# Patient Record
Sex: Female | Born: 1995 | Race: Black or African American | Hispanic: No | Marital: Single | Smoking: Never smoker
Health system: Southern US, Community
[De-identification: ages and names within clinical notes are randomized; demographics above are authoritative.]

## PROBLEM LIST (undated history)

## (undated) DIAGNOSIS — G43909 Migraine, unspecified, not intractable, without status migrainosus: Secondary | ICD-10-CM

## (undated) DIAGNOSIS — N6310 Unspecified lump in the right breast, unspecified quadrant: Secondary | ICD-10-CM

## (undated) DIAGNOSIS — M5430 Sciatica, unspecified side: Secondary | ICD-10-CM

## (undated) DIAGNOSIS — D573 Sickle-cell trait: Secondary | ICD-10-CM

## (undated) HISTORY — PX: NO PAST SURGERIES: SHX2092

---
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
## 2016-10-17 IMAGING — CR DG LUMBAR SPINE COMPLETE 4+V
5 series · 5 of 5 positions shown · non-contrast
Comparison: None.

CLINICAL DATA: Chronic right-sided back pain onset 2 months ago.
Pain radiates to the right leg.

EXAM:
LUMBAR SPINE - COMPLETE 4+ VIEW

[t lumbar spine ap]
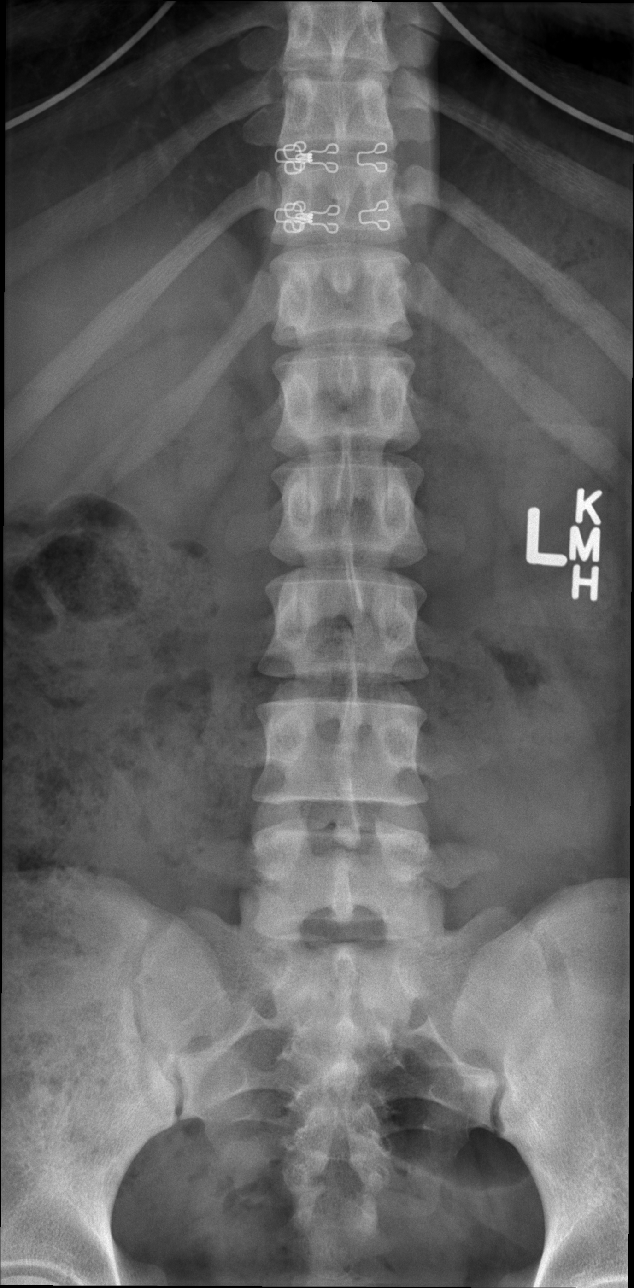

[t lumbar spine obl (1 of 2)]
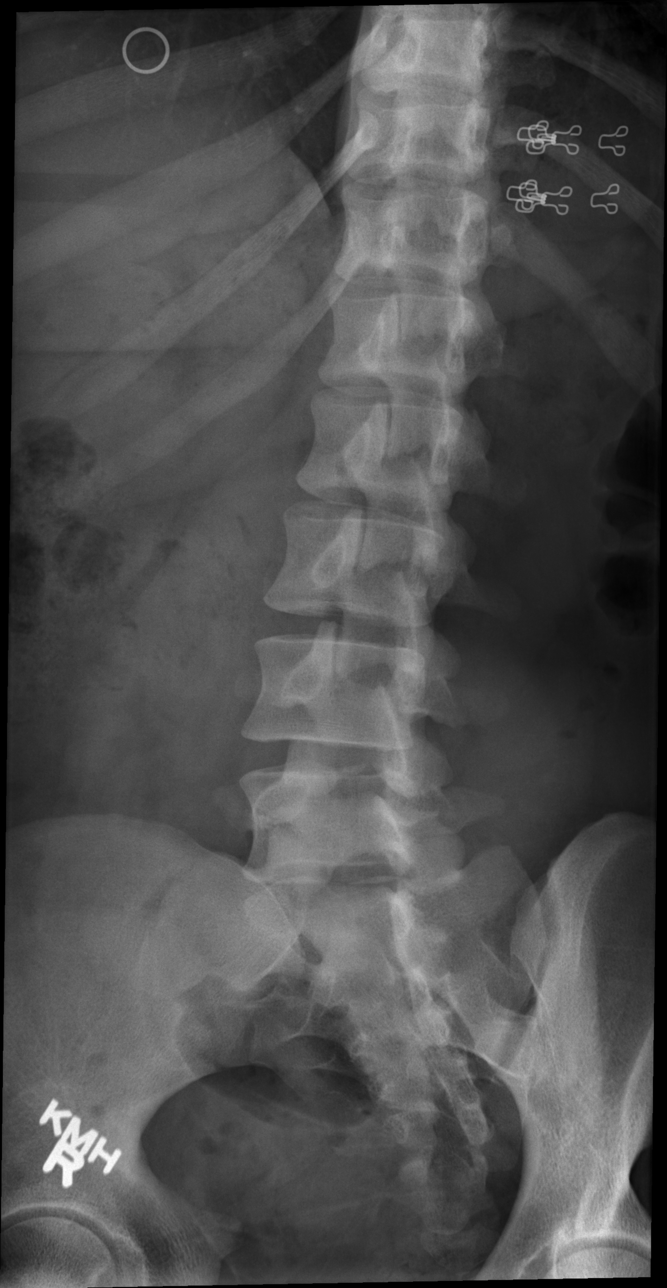

[t lumbar spine obl (2 of 2)]
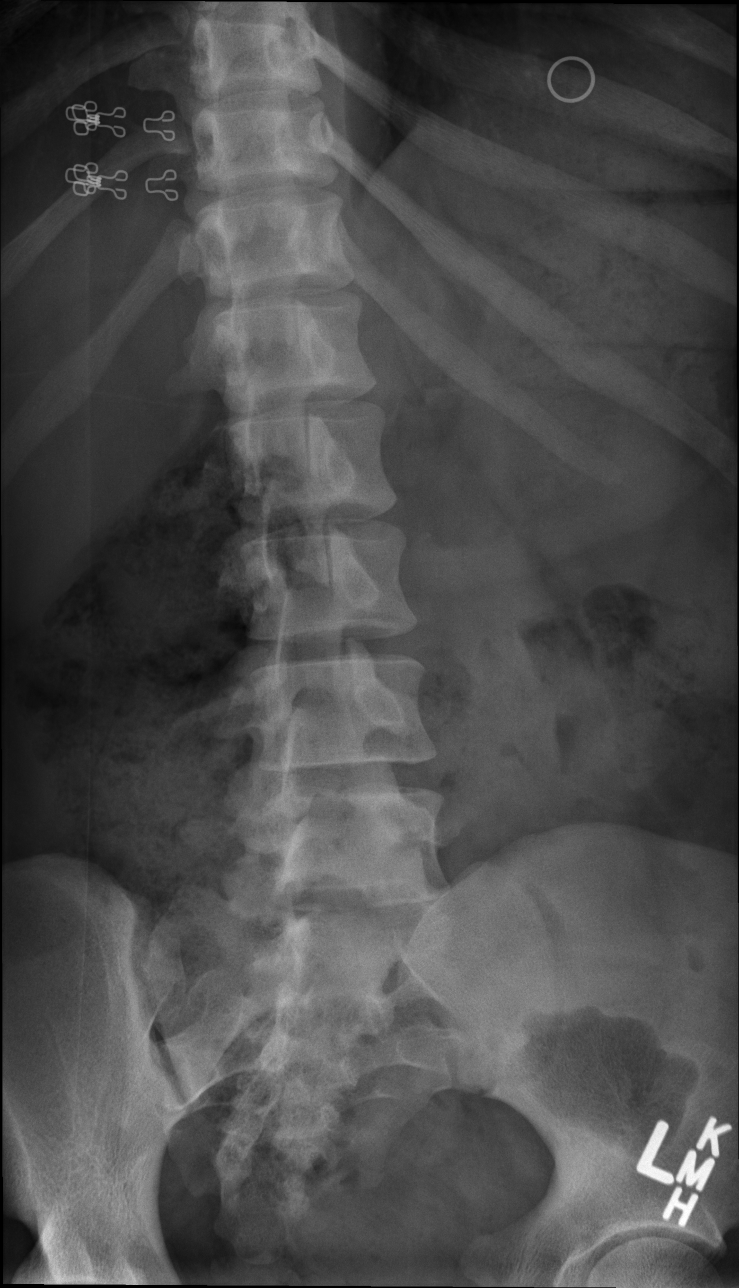

[t lumbar spine lat]
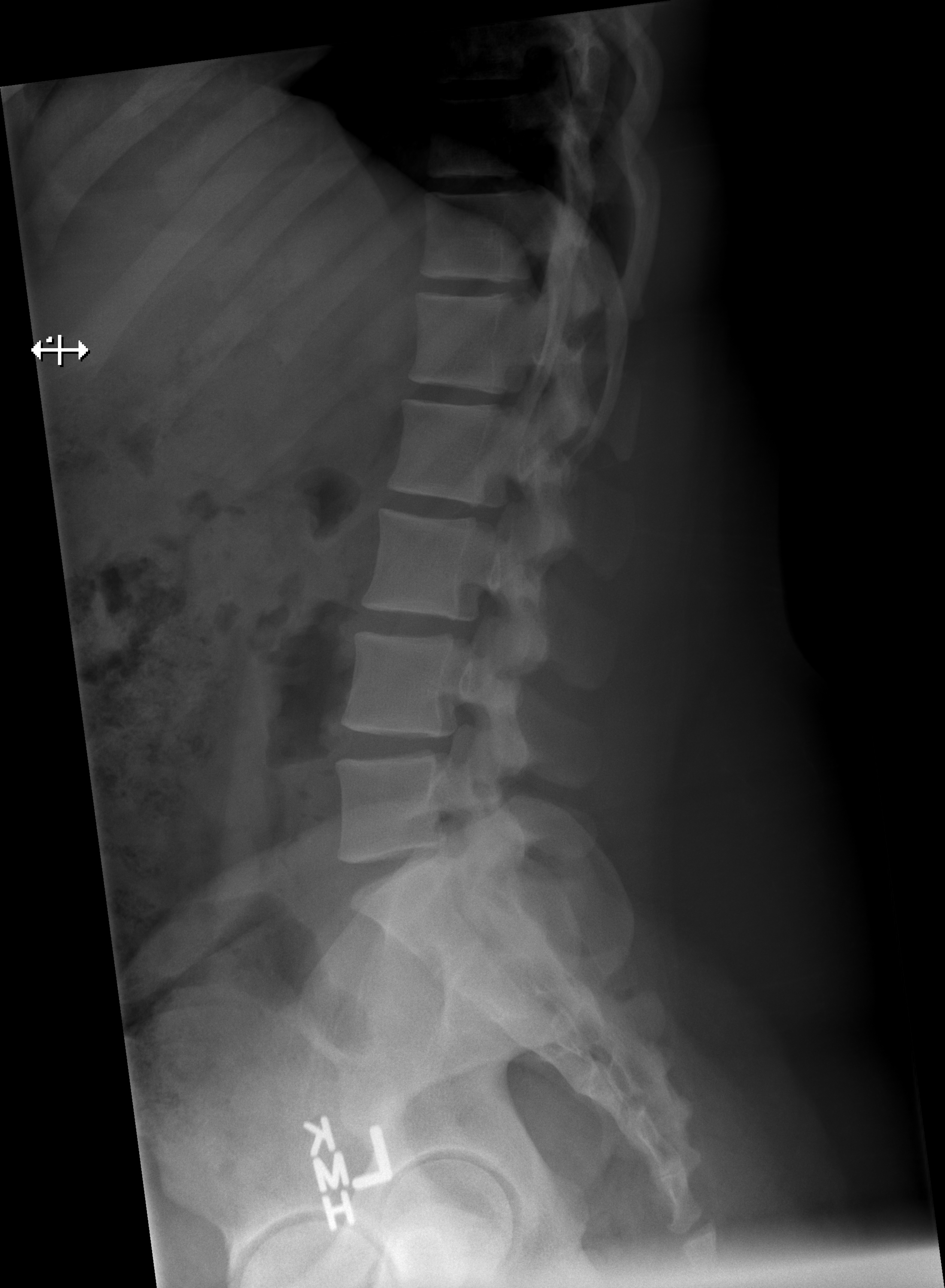

[t lumbar l-5 s-1 spot]
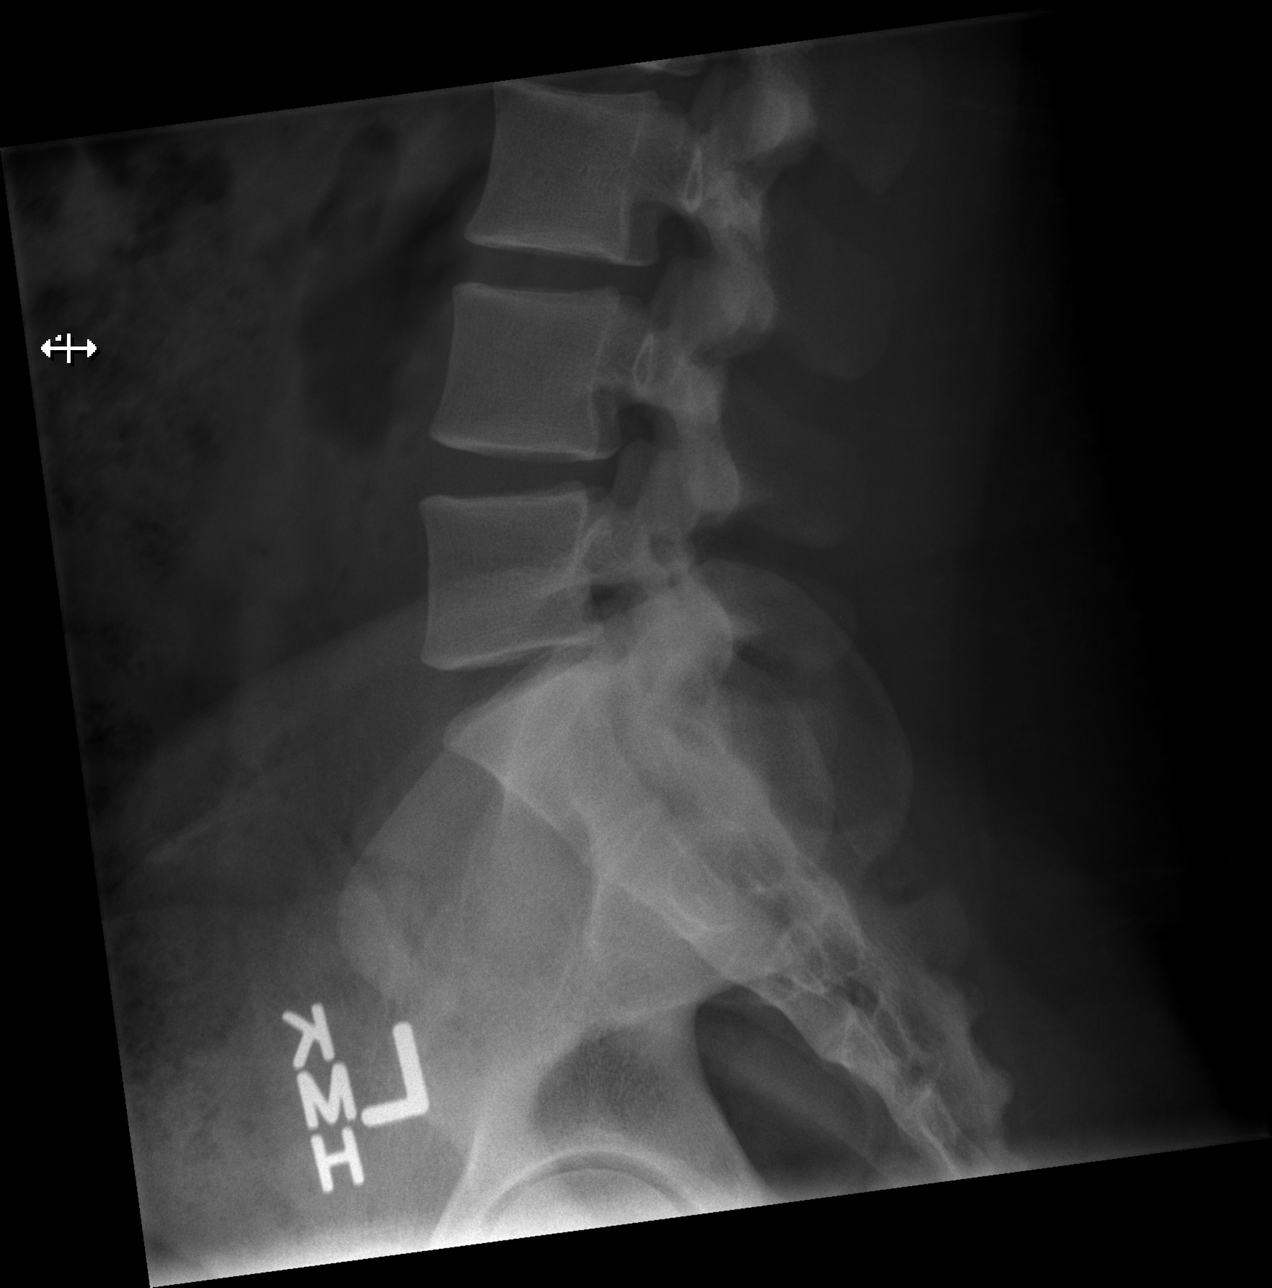

[5 of 5 positions shown; findings below may reference images not displayed]

FINDINGS: There is no evidence of lumbar spine fracture. Alignment is normal.
Intervertebral disc spaces are maintained.
IMPRESSION: Negative.

## 2016-11-07 ENCOUNTER — Encounter (HOSPITAL_COMMUNITY): Payer: Self-pay | Admitting: Emergency Medicine

## 2016-11-07 ENCOUNTER — Emergency Department (HOSPITAL_COMMUNITY)
Admission: EM | Admit: 2016-11-07 | Discharge: 2016-11-07 | Disposition: A | Discharge status: Home / Self Care | Payer: Medicaid Other | Attending: Emergency Medicine | Admitting: Emergency Medicine

## 2016-11-07 DIAGNOSIS — H579 Unspecified disorder of eye and adnexa: Secondary | ICD-10-CM | POA: Diagnosis not present

## 2016-11-07 DIAGNOSIS — Z79899 Other long term (current) drug therapy: Secondary | ICD-10-CM | POA: Diagnosis not present

## 2016-11-07 DIAGNOSIS — Z791 Long term (current) use of non-steroidal anti-inflammatories (NSAID): Secondary | ICD-10-CM | POA: Diagnosis not present

## 2016-11-07 DIAGNOSIS — R600 Localized edema: Secondary | ICD-10-CM | POA: Diagnosis present

## 2016-11-07 MED ORDER — CETIRIZINE HCL 10 MG PO TABS: 10.0000 mg | ORAL_TABLET | Freq: Every day | ORAL | 1 refills | Status: DC

## 2016-11-07 MED ORDER — FLUORESCEIN SODIUM 0.6 MG OP STRP
1.0000 | ORAL_STRIP | Freq: Once | OPHTHALMIC | Status: AC
  Administered 2016-11-07: 1 via OPHTHALMIC
  Filled 2016-11-07: qty 1

## 2016-11-07 MED ORDER — OLOPATADINE HCL 0.1 % OP SOLN: 1.0000 [drp] | Freq: Two times a day (BID) | OPHTHALMIC | 12 refills | Status: DC

## 2016-11-07 NOTE — ED Triage Notes (Signed)
patient states that she has eye irritation and swelling for couple days. Thought is was from dust and been cleaning with Lysol but still having eye swelling. Denies redness, visual changes, or drainage.

## 2016-11-07 NOTE — Discharge Instructions (Signed)
Medications: Zyrtec, Patanol  Treatment: Take Zyrtec once daily. Place 1 drop of Patanol in both eyes twice daily.  Follow-up: Please follow-up with your primary care provider if your symptoms are not improving over the next few days. Please return to emergency department if you develop any new or worsening symptoms including significant increase in swelling or pain, visual disturbances, or any other new or concerning symptoms.

## 2016-11-07 NOTE — ED Provider Notes (Signed)
[facility] DEPT Provider Note   CSN: [id number] Arrival date & time: 11/07/16  1054   By signing my name below, I, Soijett Blue, attest that this documentation has been prepared under the direction and in the presence of Eliezer Mccoy, PA-C Electronically Signed: Soijett Blue, ED Scribe. 11/07/16. 1:21 PM.  History   Chief Complaint Chief Complaint  Patient presents with  . Facial Swelling    HPI Gabrella Hicks is a 21 y.o. female who presents to the Emergency Department complaining of left sided facial swelling and left eye irritation/itching onset 2 days ago. Pt reports associated irritation sensation to right eye. Pt has tried ibuprofen with no relief of her symptoms. She notes that she recently sprayed her room with Lysol prior to the onset of her symptoms. Pt initially had swelling and irritation to left periorbital 2 days ago and left sided face that progressed to her left periorbital yesterday. Denies contacts with similar symptoms. She denies nasal congestion, vision change, photophobia, fever, sore throat, cough, eye drainage, and any other symptoms.     The history is provided by the patient. No language interpreter was used.    Past Medical History:  Diagnosis Date  . Headache   . Sciatica     There are no active problems to display for this patient.   History reviewed. No pertinent surgical history.  OB History    No data available       Home Medications    Prior to Admission medications   Medication Sig Start Date End Date Taking? Authorizing Provider  cetirizine (ZYRTEC ALLERGY) 10 MG tablet Take 1 tablet (10 mg total) by mouth daily. 11/07/16   Harlei Lehrmann, Bea Graff, PA-C  cyclobenzaprine (FLEXERIL) 5 MG tablet Take 1 tablet (5 mg total) by mouth 3 (three) times daily as needed for muscle spasms. 01/18/15   Junius Creamer, NP  diphenhydrAMINE (BENADRYL) 25 MG tablet Take 1 tablet (25 mg total) by mouth every 6 (six) hours as needed for itching (Rash). 01/05/15    Waynetta Pean, PA-C  HYDROcodone-acetaminophen (NORCO/VICODIN) 5-325 MG tablet Take 1-2 tablets every 6 hours as needed for severe pain 05/14/15   Carlisle Cater, PA-C  ibuprofen (ADVIL,MOTRIN) 600 MG tablet Take 1 tablet (600 mg total) by mouth 3 (three) times daily. 11/17/14   Jola Schmidt, MD  naproxen (NAPROSYN) 500 MG tablet Take 1 tablet (500 mg total) by mouth 2 (two) times daily. 05/14/15   Carlisle Cater, PA-C  olopatadine (PATANOL) 0.1 % ophthalmic solution Place 1 drop into both eyes 2 (two) times daily. 11/07/16   Trennon Torbeck, Bea Graff, PA-C  predniSONE (DELTASONE) 20 MG tablet 3 Tabs PO Days 1-3, then 2 tabs PO Days 4-6, then 1 tab PO Day 7-9, then Half Tab PO Day 10-12 01/18/15   Junius Creamer, NP    Family History No family history on file.  Social History Social History  Substance Use Topics  . Smoking status: Never Smoker  . Smokeless tobacco: Never Used  . Alcohol use No     Allergies   Patient has no known allergies.   Review of Systems Review of Systems  Constitutional: Negative for fever.  HENT: Positive for facial swelling (right sided). Negative for sore throat.   Eyes: Negative for photophobia, discharge and visual disturbance.       +left periorbital swelling. +Irritation sensation to right eye  Respiratory: Negative for cough.      Physical Exam Updated Vital Signs BP 126/79 (BP Location: Left Arm)  Pulse 82   Temp 98.1 F (36.7 C) (Oral)   Resp 16   Wt 177 lb (80.3 kg)   SpO2 99%   BMI 32.37 kg/m   Physical Exam  Constitutional: She appears well-developed and well-nourished. No distress.  HENT:  Head: Normocephalic and atraumatic.  Mouth/Throat: Oropharynx is clear and moist. No oropharyngeal exudate.  Eyes: Conjunctivae are normal. Pupils are equal, round, and reactive to light. Right eye exhibits no discharge. Left eye exhibits no discharge. No scleral icterus.  Slit lamp exam:      The right eye shows no fluorescein uptake.       The left eye  shows no fluorescein uptake.  Mild periorbital edema bilaterally. No tenderness of erythema. No conjunctivitis or drainage noted. No pain or difficulty with EOMs. No fluorescein uptake noted bilaterally.    Visual Acuity  Right Eye Distance: 20/15 Left Eye Distance: 20/15 Bilateral Distance: 20/15  Neck: Normal range of motion. Neck supple. No thyromegaly present.  Cardiovascular: Normal rate, regular rhythm, normal heart sounds and intact distal pulses.  Exam reveals no gallop and no friction rub.   No murmur heard. Pulmonary/Chest: Effort normal and breath sounds normal. No stridor. No respiratory distress. She has no wheezes. She has no rales.  Musculoskeletal: She exhibits no edema.  Lymphadenopathy:    She has no cervical adenopathy.  Neurological: She is alert. Coordination normal.  Skin: Skin is warm and dry. No rash noted. She is not diaphoretic. No pallor.  Psychiatric: She has a normal mood and affect.  Nursing note and vitals reviewed.    ED Treatments / Results  DIAGNOSTIC STUDIES: Oxygen Saturation is 99% on RA, nl by my interpretation.    COORDINATION OF CARE: 1:20 PM Discussed treatment plan with pt at bedside which includes visual acuity and pt agreed to plan.   Labs (all labs ordered are listed, but only abnormal results are displayed) Labs Reviewed - No data to display  EKG  EKG Interpretation None       Radiology No results found.  Procedures Procedures (including critical care time)  Medications Ordered in ED Medications  fluorescein ophthalmic strip 1 strip (1 strip Both Eyes Given by Other 11/07/16 1355)     Initial Impression / Assessment and Plan / ED Course  I have reviewed the triage vital signs and the nursing notes.  Pertinent labs & imaging results that were available during my care of the patient were reviewed by me and considered in my medical decision making (see chart for details).      Suspect etiology as allergic. Patient  with no vision changes or uptake on fluorescein. Eyes appear normal with only mild periorbital edema. No tenderness, drainage, or problems with EOMs. Visual acuity unaffected. Pt will discharged home with prescription zyrtec and patanol eye drops. Patient given follow-up to primary care provider if symptoms are not improving by the end of the week. Strict return precautions given. Patient understands and agrees plan. Patient vitals stable throughout ED course discharged in satisfactory condition.  Final Clinical Impressions(s) / ED Diagnoses   Final diagnoses:  Allergic eye reaction    New Prescriptions Discharge Medication List as of 11/07/2016  2:29 PM    START taking these medications   Details  olopatadine (PATANOL) 0.1 % ophthalmic solution Place 1 drop into both eyes 2 (two) times daily., Starting Mon 11/07/2016, Print       I personally performed the services described in this documentation, which was scribed in my presence.  The recorded information has been reviewed and is accurate.     Frederica Kuster, PA-C 11/07/16 Esaw Dace, MD 11/07/16 2102

## 2017-02-24 ENCOUNTER — Encounter (HOSPITAL_COMMUNITY): Payer: Self-pay

## 2017-02-24 ENCOUNTER — Emergency Department (HOSPITAL_COMMUNITY)
Admission: EM | Admit: 2017-02-24 | Discharge: 2017-02-24 | Disposition: A | Discharge status: Home / Self Care | Payer: Medicaid Other | Attending: Emergency Medicine | Admitting: Emergency Medicine

## 2017-02-24 DIAGNOSIS — M5431 Sciatica, right side: Secondary | ICD-10-CM | POA: Insufficient documentation

## 2017-02-24 DIAGNOSIS — M545 Low back pain: Secondary | ICD-10-CM | POA: Diagnosis present

## 2017-02-24 DIAGNOSIS — Z79899 Other long term (current) drug therapy: Secondary | ICD-10-CM | POA: Insufficient documentation

## 2017-02-24 MED ORDER — NAPROXEN 500 MG PO TABS: 500.0000 mg | ORAL_TABLET | Freq: Two times a day (BID) | ORAL | 0 refills | Status: AC

## 2017-02-24 MED ORDER — CYCLOBENZAPRINE HCL 5 MG PO TABS: 5.0000 mg | ORAL_TABLET | Freq: Two times a day (BID) | ORAL | 0 refills | Status: DC | PRN

## 2017-02-24 NOTE — Discharge Instructions (Signed)
Take naproxen twice a day with meals for the next 10 days.  Do not take other anti-inflammatories at the same time (Advil, Motrin, ibuprofen, Aleve).  You may supplement with Tylenol if you need further pain control. You may use Flexeril as needed for muscle stiffness or soreness.  Have caution, as this may make you tired.  Do not drive or operate heavy machinery until you know how this affects you. Continue to do gentle back stretches to help control your symptoms. Follow-up with your primary care doctor in 10 days if your symptoms are not improving. Return to the emergency room if you develop loss of bowel or bladder control, fevers, or any new or worsening symptoms.

## 2017-02-24 NOTE — ED Triage Notes (Signed)
Patient with history of diagnosed sciatica pain, states she was diagnosed in the ED and was given a one month prescription of muscle relaxers and steroids. Patient reports she has since run out of her prescriptions and followed up with her PCP, who only gave her steroids. Patient reports the steroids dont work and she has been missing school and work due to the pain. Patient requesting muscle relaxer prescription. Patient denies distal paresthesias. Patient states she last took ibuprofen for pain 2 days ago for a headache, but denies taking any pain medications for her back. Patient ambulated to triage without assistance. Patient reports taking an [facility] Popper to get to [facility] today.

## 2017-02-24 NOTE — ED Provider Notes (Signed)
[facility] DEPT Provider Note   CSN: [id number] Arrival date & time: 02/24/17  1231     History   Chief Complaint Chief Complaint  Patient presents with  . Back Pain    HPI Hannah Hicks is a 21 y.o. female presenting with right side low back pain.  Patient states she has a history of sciatica, and since Sunday, she has had a flare of her sciatica.  Her sciatica is on the right side, and extends down the back of her right leg.  She states she has not been taking anything for the pain, including Tylenol or ibuprofen.  She has been trying stretches without relief of symptoms.  Sitting makes the pain worse.  At one point, she was getting steroid injections in her back by here PCP, but states that the steroids made her have difficulty sleeping, so she is not doing that anymore.  She denies fall, trauma, or injury.  Nothing precipitated the pain on Sunday.  She is reports abnormal sensation of her right upper leg, no numbness or tingling elsewhere.  She denies loss of bowel or bladder control.  She denies other red flags including fever, chills, history of cancer, or IV drug use.  She denies pain on the left side.   HPI  Past Medical History:  Diagnosis Date  . Headache   . Sciatica     There are no active problems to display for this patient.   History reviewed. No pertinent surgical history.  OB History    No data available       Home Medications    Prior to Admission medications   Medication Sig Start Date End Date Taking? Authorizing Provider  cetirizine (ZYRTEC ALLERGY) 10 MG tablet Take 1 tablet (10 mg total) by mouth daily. 11/07/16   Law, Bea Graff, PA-C  cyclobenzaprine (FLEXERIL) 5 MG tablet Take 1 tablet (5 mg total) by mouth 2 (two) times daily as needed for muscle spasms. 02/24/17   Akaiya Touchette, PA-C  diphenhydrAMINE (BENADRYL) 25 MG tablet Take 1 tablet (25 mg total) by mouth every 6 (six) hours as needed for itching (Rash).  01/05/15   Waynetta Pean, PA-C  HYDROcodone-acetaminophen (NORCO/VICODIN) 5-325 MG tablet Take 1-2 tablets every 6 hours as needed for severe pain 05/14/15   Carlisle Cater, PA-C  ibuprofen (ADVIL,MOTRIN) 600 MG tablet Take 1 tablet (600 mg total) by mouth 3 (three) times daily. 11/17/14   Jola Schmidt, MD  naproxen (NAPROSYN) 500 MG tablet Take 1 tablet (500 mg total) by mouth 2 (two) times daily with a meal. 02/24/17 03/06/17  Lavonta Tillis, PA-C  olopatadine (PATANOL) 0.1 % ophthalmic solution Place 1 drop into both eyes 2 (two) times daily. 11/07/16   Law, Bea Graff, PA-C  predniSONE (DELTASONE) 20 MG tablet 3 Tabs PO Days 1-3, then 2 tabs PO Days 4-6, then 1 tab PO Day 7-9, then Half Tab PO Day 10-12 01/18/15   Junius Creamer, NP    Family History No family history on file.  Social History Social History  Substance Use Topics  . Smoking status: Never Smoker  . Smokeless tobacco: Never Used  . Alcohol use No     Allergies   Patient has no known allergies.   Review of Systems Review of Systems  Musculoskeletal: Positive for back pain.  Skin: Negative for wound.  Neurological: Negative for numbness.     Physical Exam Updated Vital Signs BP 125/75 (BP Location: Right Arm)   Pulse 78  Temp 99.1 F (37.3 C)   Resp 18   Ht 5' 1.5" (1.562 m)   Wt 79.4 kg (175 lb)   LMP 02/15/2017   SpO2 99%   BMI 32.53 kg/m   Physical Exam  Constitutional: She is oriented to person, place, and time. She appears well-developed and well-nourished. No distress.  HENT:  Head: Normocephalic and atraumatic.  Eyes: EOM are normal.  Neck: Normal range of motion.  Full active ROM of neck without pain. No pain of c-spine  Cardiovascular: Normal rate, regular rhythm and intact distal pulses.   Pulmonary/Chest: Effort normal and breath sounds normal. No respiratory distress. She has no wheezes.  Abdominal: She exhibits no distension.  Musculoskeletal: Normal range of motion.  Full active  range of motion of all extremities.  Sensation intact x4.  Strength intact x4.  Patient ambulatory with minimal pain.  Tenderness to palpation of right low back.  No pain over midline spine.  Radial and pedal pulses intact bilaterally.  No obvious deformities or injuries.  Neurological: She is alert and oriented to person, place, and time. She has normal strength. No sensory deficit. Gait normal. GCS eye subscore is 4. GCS verbal subscore is 5. GCS motor subscore is 6.  Skin: Skin is warm and dry.  Psychiatric: She has a normal mood and affect.  Nursing note and vitals reviewed.    ED Treatments / Results  Labs (all labs ordered are listed, but only abnormal results are displayed) Labs Reviewed - No data to display  EKG  EKG Interpretation None       Radiology No results found.  Procedures Procedures (including critical care time)  Medications Ordered in ED Medications - No data to display   Initial Impression / Assessment and Plan / ED Course  I have reviewed the triage vital signs and the nursing notes.  Pertinent labs & imaging results that were available during my care of the patient were reviewed by me and considered in my medical decision making (see chart for details).     Patient presenting with right-sided back pain radiating down her right leg, states it feels like her normal sciatica flare.  She is not taking anything for her flare.  Physical exam shows patient without neurologic deficits.  No red flags for back pain.  I do not believe imaging is necessary at this time.  Will treat conservatively with NSAIDs and muscle relaxers.  Patient to follow-up with primary care for further evaluation and management of her symptoms.  At this time, patient appears safe for discharge.  Return precautions given.  Patient states she understands and agrees to plan.  Final Clinical Impressions(s) / ED Diagnoses   Final diagnoses:  Sciatica of right side    New  Prescriptions Discharge Medication List as of 02/24/2017  2:43 PM       Franchot Heidelberg, PA-C 02/24/17 2333    Milton Ferguson, MD 02/27/17 [phone]

## 2017-05-10 ENCOUNTER — Other Ambulatory Visit: Payer: Self-pay | Admitting: Family

## 2017-05-10 DIAGNOSIS — N6312 Unspecified lump in the right breast, upper inner quadrant: Secondary | ICD-10-CM

## 2017-05-17 ENCOUNTER — Other Ambulatory Visit: Payer: Self-pay

## 2017-05-17 ENCOUNTER — Other Ambulatory Visit: Payer: Self-pay | Admitting: Family

## 2017-05-17 DIAGNOSIS — N6312 Unspecified lump in the right breast, upper inner quadrant: Secondary | ICD-10-CM

## 2017-05-18 ENCOUNTER — Other Ambulatory Visit: Payer: Medicaid Other

## 2017-05-24 ENCOUNTER — Ambulatory Visit (INDEPENDENT_AMBULATORY_CARE_PROVIDER_SITE_OTHER): Payer: Medicaid Other | Admitting: Family

## 2017-05-24 ENCOUNTER — Other Ambulatory Visit: Payer: Self-pay

## 2017-05-24 ENCOUNTER — Encounter: Payer: Self-pay | Admitting: Family

## 2017-05-24 VITALS — BP 114/71 | HR 104 | Ht 62.0 in | Wt 183.6 lb

## 2017-05-24 DIAGNOSIS — Z30430 Encounter for insertion of intrauterine contraceptive device: Secondary | ICD-10-CM | POA: Diagnosis not present

## 2017-05-24 DIAGNOSIS — Z113 Encounter for screening for infections with a predominantly sexual mode of transmission: Secondary | ICD-10-CM

## 2017-05-24 DIAGNOSIS — Z975 Presence of (intrauterine) contraceptive device: Secondary | ICD-10-CM | POA: Insufficient documentation

## 2017-05-24 MED ORDER — IBUPROFEN 200 MG PO TABS
800.0000 mg | ORAL_TABLET | Freq: Once | ORAL | Status: AC
  Administered 2017-05-24: 800 mg via ORAL

## 2017-05-24 MED ORDER — LEVONORGESTREL 20 MCG/24HR IU IUD
INTRAUTERINE_SYSTEM | Freq: Once | INTRAUTERINE | Status: AC
Start: 2017-05-24 — End: 2017-05-25
  Administered 2017-05-25: 17:00:00 via INTRAUTERINE

## 2017-05-24 NOTE — Patient Instructions (Signed)

## 2017-05-24 NOTE — Progress Notes (Addendum)
THIS RECORD MAY CONTAIN CONFIDENTIAL INFORMATION THAT SHOULD NOT BE RELEASED WITHOUT REVIEW OF THE SERVICE PROVIDER.  Adolescent Medicine Consultation Follow-Up Visit Hannah Hicks  is a 22 y.o. female referred by [place], Rebeca Allegra* here today for follow-up regarding IUD insertion.    Pertinent Labs? No Growth Chart Viewed? no   History was provided by the patient.  Interpreter? no  PCP Confirmed?  yes  My Chart Activated?   no    CC: Desires IUD insertion.   HPI:    Has been on OCPs for birth control  Desires IUD On period now  Last sex was before Christmas No abdominal pain, dysuria, pelvic or abdominal pain  Review of Systems  Constitutional: Negative for malaise/fatigue.  Eyes: Negative for double vision.  Respiratory: Negative for shortness of breath.   Cardiovascular: Negative for chest pain and palpitations.  Gastrointestinal: Negative for abdominal pain, constipation, diarrhea, nausea and vomiting.  Genitourinary: Negative for dysuria.  Musculoskeletal: Negative for joint pain and myalgias.  Skin: Negative for rash.  Neurological: Negative for dizziness and headaches.  Endo/Heme/Allergies: Does not bruise/bleed easily.     No LMP recorded. No Known Allergies Outpatient Medications Prior to Visit  Medication Sig Dispense Refill  . cetirizine (ZYRTEC ALLERGY) 10 MG tablet Take 1 tablet (10 mg total) by mouth daily. 30 tablet 1  . cyclobenzaprine (FLEXERIL) 5 MG tablet Take 1 tablet (5 mg total) by mouth 2 (two) times daily as needed for muscle spasms. 15 tablet 0  . diphenhydrAMINE (BENADRYL) 25 MG tablet Take 1 tablet (25 mg total) by mouth every 6 (six) hours as needed for itching (Rash). 30 tablet 0  . HYDROcodone-acetaminophen (NORCO/VICODIN) 5-325 MG tablet Take 1-2 tablets every 6 hours as needed for severe pain 12 tablet 0  . ibuprofen (ADVIL,MOTRIN) 600 MG tablet Take 1 tablet (600 mg total) by mouth 3 (three) times daily. 30 tablet 0  .  olopatadine (PATANOL) 0.1 % ophthalmic solution Place 1 drop into both eyes 2 (two) times daily. 5 mL 12  . predniSONE (DELTASONE) 20 MG tablet 3 Tabs PO Days 1-3, then 2 tabs PO Days 4-6, then 1 tab PO Day 7-9, then Half Tab PO Day 10-12 20 tablet 0   No facility-administered medications prior to visit.      Physical Exam:  Vitals:   05/24/17 1346  BP: 114/71  Pulse: (!) 104  Weight: 183 lb 9.6 oz (83.3 kg)  Height: 5\' 2"  (1.575 m)   BP 114/71   Pulse (!) 104   Ht 5\' 2"  (1.575 m)   Wt 183 lb 9.6 oz (83.3 kg)   LMP 05/21/2017 (Exact Date)   BMI 33.58 kg/m  Body mass index: body mass index is 33.58 kg/m. Growth percentile SmartLinks can only be used for patients less than 79 years old.   Physical Exam  Constitutional: She is oriented to person, place, and time. She appears well-developed. No distress.  HENT:  Head: Normocephalic and atraumatic.  Eyes: EOM are normal. Pupils are equal, round, and reactive to light. No scleral icterus.  Neck: Normal range of motion. Neck supple. No thyromegaly present.  Cardiovascular: Normal rate and regular rhythm.  No murmur heard. Pulmonary/Chest: Effort normal and breath sounds normal.  Abdominal: Soft.  Musculoskeletal: Normal range of motion. She exhibits no edema.  Lymphadenopathy:    She has no cervical adenopathy.  Neurological: She is alert and oriented to person, place, and time. No cranial nerve deficit.  Skin: Skin is warm and dry.  No rash noted.  Psychiatric: She has a normal mood and affect.  Vitals reviewed.   Assessment/Plan: 1. Encounter for insertion of mirena IUD -Mirena IUD Insertion   The pt presents for Mirena IUD placement.  No contraindications for placement.    Patient's last menstrual period was 05/21/2017 (exact date).  UHCG: negative   Last unprotected sex:  As per HPI   Risks & benefits of IUD discussed  The IUD was purchased and supplied by [facility].  Packaging instructions supplied to patient   Consent form signed.  The patient denies any allergies to anesthetics or antiseptics.   Procedure:  Pt was placed in lithotomy position.  Speculum was inserted.  GC/CT swab was used to collect sample for STI testing.  Tenaculum was used to stabilize the cervix by clasping at 12 o'clock  Betadine was used to clean the cervix and cervical os.  Dilators were used. The uterus was sounded to 6.5 cm.  Mirena was inserted using manufacturer provided applicator.  Strings were trimmed to 3 cm external to os.  Tenaculum was removed.  Speculum was removed.   The patient was advised to move slowly from a supine to an upright position   The patient denied any concerns or complaints   The patient was instructed to schedule a follow-up appt in 1 month and to call sooner if any concerns.   The patient acknowledged agreement and understanding of the plan.   - IUD Insertion Mirena NDI [phone]  Follow-up:   One month string check     Medical decision-making:  >15 minutes spent face to face with patient with more than 50% of appointment spent reviewing IUD efficacy, side effects, procedure and follow-up care.

## 2017-05-25 ENCOUNTER — Encounter: Payer: Self-pay | Admitting: Family

## 2017-05-25 LAB — C. TRACHOMATIS/N. GONORRHOEAE RNA
C. trachomatis RNA, TMA: NOT DETECTED
N. gonorrhoeae RNA, TMA: NOT DETECTED

## 2017-05-30 ENCOUNTER — Other Ambulatory Visit: Payer: Self-pay | Admitting: Family

## 2017-05-30 ENCOUNTER — Ambulatory Visit
Admission: RE | Admit: 2017-05-30 | Discharge: 2017-05-30 | Disposition: A | Discharge status: Home / Self Care | Payer: Medicaid Other | Source: Ambulatory Visit | Attending: Family | Admitting: Family

## 2017-05-30 DIAGNOSIS — N6312 Unspecified lump in the right breast, upper inner quadrant: Secondary | ICD-10-CM

## 2017-05-30 DIAGNOSIS — D241 Benign neoplasm of right breast: Secondary | ICD-10-CM

## 2017-06-21 ENCOUNTER — Ambulatory Visit: Payer: Medicaid Other | Admitting: Family

## 2017-06-27 ENCOUNTER — Encounter: Payer: Self-pay | Admitting: Family

## 2017-06-27 ENCOUNTER — Ambulatory Visit (INDEPENDENT_AMBULATORY_CARE_PROVIDER_SITE_OTHER): Payer: Medicaid Other | Admitting: Family

## 2017-06-27 VITALS — BP 101/65 | HR 89 | Ht 62.21 in | Wt 188.6 lb

## 2017-06-27 DIAGNOSIS — Z30431 Encounter for routine checking of intrauterine contraceptive device: Secondary | ICD-10-CM

## 2017-06-27 NOTE — Progress Notes (Signed)
THIS RECORD MAY CONTAIN CONFIDENTIAL INFORMATION THAT SHOULD NOT BE RELEASED WITHOUT REVIEW OF THE SERVICE PROVIDER.  Adolescent Medicine Consultation Follow-Up Visit Hannah Hicks  is a 22 y.o. female referred by [place], Rebeca Allegra* here today for follow-up regarding IUD string check.    Last seen in [facility] on 05/24/17 for IUD insertion.  Plan at last visit included string check at 1 month.  Pertinent Labs? No Growth Chart Viewed? no   History was provided by the patient.  Interpreter? no  Chief Complaint  Patient presents with  . Follow-up    HPI:    IUD placed 05/24/17 Has been doing well.  Here for string check   No LMP recorded. No Known Allergies Outpatient Medications Prior to Visit  Medication Sig Dispense Refill  . cyclobenzaprine (FLEXERIL) 5 MG tablet Take 1 tablet (5 mg total) by mouth 2 (two) times daily as needed for muscle spasms. (Patient not taking: Reported on 06/27/2017) 15 tablet 0  . HYDROcodone-acetaminophen (NORCO/VICODIN) 5-325 MG tablet Take 1-2 tablets every 6 hours as needed for severe pain (Patient not taking: Reported on 05/24/2017) 12 tablet 0  . ibuprofen (ADVIL,MOTRIN) 600 MG tablet Take 1 tablet (600 mg total) by mouth 3 (three) times daily. (Patient not taking: Reported on 06/27/2017) 30 tablet 0  . predniSONE (DELTASONE) 20 MG tablet 3 Tabs PO Days 1-3, then 2 tabs PO Days 4-6, then 1 tab PO Day 7-9, then Half Tab PO Day 10-12 (Patient not taking: Reported on 06/27/2017) 20 tablet 0   No facility-administered medications prior to visit.      Patient Active Problem List   Diagnosis Date Noted  . IUD (intrauterine device) in place 05/24/2017    Physical Exam:  Vitals:   06/27/17 1437  BP: 101/65  Pulse: 89  Weight: 188 lb 9.6 oz (85.5 kg)  Height: 5' 2.21" (1.58 m)   BP 101/65   Pulse 89   Ht 5' 2.21" (1.58 m)   Wt 188 lb 9.6 oz (85.5 kg)   BMI 34.27 kg/m  Body mass index: body mass index is 34.27  kg/m. Growth percentile SmartLinks can only be used for patients less than 32 years old.  Physical Exam  Constitutional: She is oriented to person, place, and time. She appears well-developed and well-nourished. No distress.  Genitourinary: Vagina normal.  Genitourinary Comments: Clear vaginal discharge. Speculum placed. IUD strings visible outside the entrance of the cervix  Neurological: She is alert and oriented to person, place, and time.    Assessment/Plan: 1. IUD check up - Strings in place on exam - Follow-up in 1 year   Dorna Leitz MD [facility], PGY-1

## 2017-06-28 ENCOUNTER — Encounter: Payer: Self-pay | Admitting: Family

## 2017-07-18 ENCOUNTER — Other Ambulatory Visit: Payer: Self-pay | Admitting: General Surgery

## 2017-07-18 DIAGNOSIS — N6320 Unspecified lump in the left breast, unspecified quadrant: Secondary | ICD-10-CM

## 2017-07-24 ENCOUNTER — Ambulatory Visit
Admission: RE | Admit: 2017-07-24 | Discharge: 2017-07-24 | Disposition: A | Discharge status: Home / Self Care | Payer: Medicaid Other | Source: Ambulatory Visit | Attending: General Surgery | Admitting: General Surgery

## 2017-07-24 DIAGNOSIS — N6320 Unspecified lump in the left breast, unspecified quadrant: Secondary | ICD-10-CM

## 2017-08-03 ENCOUNTER — Other Ambulatory Visit: Payer: Self-pay | Admitting: General Surgery

## 2017-08-07 ENCOUNTER — Encounter (HOSPITAL_BASED_OUTPATIENT_CLINIC_OR_DEPARTMENT_OTHER): Payer: Self-pay | Admitting: *Deleted

## 2017-08-07 ENCOUNTER — Other Ambulatory Visit: Payer: Self-pay

## 2017-08-07 DIAGNOSIS — N6310 Unspecified lump in the right breast, unspecified quadrant: Secondary | ICD-10-CM

## 2017-08-07 HISTORY — DX: Unspecified lump in the right breast, unspecified quadrant: N63.10

## 2017-08-07 NOTE — Pre-Procedure Instructions (Signed)
To come pick up Ensure pre-surgery drink 10 oz. - to drink by 1130 DOS. 

## 2017-08-11 NOTE — Progress Notes (Signed)
Ensure pre surgery drink given with instructions to complete by [facility], pt verbalized understanding.

## 2017-08-14 ENCOUNTER — Ambulatory Visit (HOSPITAL_BASED_OUTPATIENT_CLINIC_OR_DEPARTMENT_OTHER): Payer: Medicaid Other | Admitting: Anesthesiology

## 2017-08-14 ENCOUNTER — Ambulatory Visit (HOSPITAL_BASED_OUTPATIENT_CLINIC_OR_DEPARTMENT_OTHER)
Admission: RE | Admit: 2017-08-14 | Discharge: 2017-08-14 | Disposition: A | Discharge status: Home / Self Care | Payer: Medicaid Other | Source: Ambulatory Visit | Attending: General Surgery | Admitting: General Surgery

## 2017-08-14 ENCOUNTER — Other Ambulatory Visit: Payer: Self-pay

## 2017-08-14 ENCOUNTER — Encounter (HOSPITAL_BASED_OUTPATIENT_CLINIC_OR_DEPARTMENT_OTHER)
Admission: RE | Disposition: A | Discharge status: Home / Self Care | Payer: Self-pay | Source: Ambulatory Visit | Attending: General Surgery

## 2017-08-14 ENCOUNTER — Encounter (HOSPITAL_BASED_OUTPATIENT_CLINIC_OR_DEPARTMENT_OTHER): Payer: Self-pay | Admitting: Anesthesiology

## 2017-08-14 DIAGNOSIS — D573 Sickle-cell trait: Secondary | ICD-10-CM | POA: Insufficient documentation

## 2017-08-14 DIAGNOSIS — Z79899 Other long term (current) drug therapy: Secondary | ICD-10-CM | POA: Insufficient documentation

## 2017-08-14 DIAGNOSIS — Z833 Family history of diabetes mellitus: Secondary | ICD-10-CM | POA: Insufficient documentation

## 2017-08-14 DIAGNOSIS — D241 Benign neoplasm of right breast: Secondary | ICD-10-CM | POA: Insufficient documentation

## 2017-08-14 DIAGNOSIS — Z800 Family history of malignant neoplasm of digestive organs: Secondary | ICD-10-CM | POA: Insufficient documentation

## 2017-08-14 DIAGNOSIS — F17200 Nicotine dependence, unspecified, uncomplicated: Secondary | ICD-10-CM | POA: Insufficient documentation

## 2017-08-14 DIAGNOSIS — Z8249 Family history of ischemic heart disease and other diseases of the circulatory system: Secondary | ICD-10-CM | POA: Insufficient documentation

## 2017-08-14 DIAGNOSIS — M5430 Sciatica, unspecified side: Secondary | ICD-10-CM | POA: Insufficient documentation

## 2017-08-14 DIAGNOSIS — Z818 Family history of other mental and behavioral disorders: Secondary | ICD-10-CM | POA: Insufficient documentation

## 2017-08-14 DIAGNOSIS — Z820 Family history of epilepsy and other diseases of the nervous system: Secondary | ICD-10-CM | POA: Insufficient documentation

## 2017-08-14 DIAGNOSIS — Z803 Family history of malignant neoplasm of breast: Secondary | ICD-10-CM | POA: Insufficient documentation

## 2017-08-14 DIAGNOSIS — Z811 Family history of alcohol abuse and dependence: Secondary | ICD-10-CM | POA: Insufficient documentation

## 2017-08-14 DIAGNOSIS — G43909 Migraine, unspecified, not intractable, without status migrainosus: Secondary | ICD-10-CM | POA: Insufficient documentation

## 2017-08-14 DIAGNOSIS — Z832 Family history of diseases of the blood and blood-forming organs and certain disorders involving the immune mechanism: Secondary | ICD-10-CM | POA: Insufficient documentation

## 2017-08-14 DIAGNOSIS — Z8049 Family history of malignant neoplasm of other genital organs: Secondary | ICD-10-CM | POA: Insufficient documentation

## 2017-08-14 HISTORY — DX: Unspecified lump in the right breast, unspecified quadrant: N63.10

## 2017-08-14 HISTORY — DX: Migraine, unspecified, not intractable, without status migrainosus: G43.909

## 2017-08-14 HISTORY — DX: Sickle-cell trait: D57.3

## 2017-08-14 HISTORY — PX: EXCISION OF BREAST LESION: SHX6676

## 2017-08-14 LAB — POCT PREGNANCY, URINE: Preg Test, Ur: NEGATIVE

## 2017-08-14 SURGERY — EXCISION OF BREAST LESION
Anesthesia: General | Site: Breast | Laterality: Right

## 2017-08-14 MED ORDER — FENTANYL CITRATE (PF) 100 MCG/2ML IJ SOLN
INTRAMUSCULAR | Status: DC | PRN
  Administered 2017-08-14 (×2): 25 ug via INTRAVENOUS
  Administered 2017-08-14: 100 ug via INTRAVENOUS
  Administered 2017-08-14: 50 ug via INTRAVENOUS

## 2017-08-14 MED ORDER — PROPOFOL 10 MG/ML IV BOLUS
INTRAVENOUS | Status: AC
  Filled 2017-08-14: qty 20

## 2017-08-14 MED ORDER — LIDOCAINE HCL (CARDIAC) 20 MG/ML IV SOLN
INTRAVENOUS | Status: AC
  Filled 2017-08-14: qty 5

## 2017-08-14 MED ORDER — KETOROLAC TROMETHAMINE 15 MG/ML IJ SOLN
INTRAMUSCULAR | Status: AC
  Filled 2017-08-14: qty 1

## 2017-08-14 MED ORDER — BUPIVACAINE HCL (PF) 0.25 % IJ SOLN
INTRAMUSCULAR | Status: DC | PRN
  Administered 2017-08-14: 10 mL

## 2017-08-14 MED ORDER — MIDAZOLAM HCL 2 MG/2ML IJ SOLN
INTRAMUSCULAR | Status: AC
  Filled 2017-08-14: qty 2

## 2017-08-14 MED ORDER — ENSURE PRE-SURGERY PO LIQD: 296.0000 mL | Freq: Once | ORAL | Status: DC

## 2017-08-14 MED ORDER — CEFAZOLIN SODIUM-DEXTROSE 2-4 GM/100ML-% IV SOLN
INTRAVENOUS | Status: AC
  Filled 2017-08-14: qty 100

## 2017-08-14 MED ORDER — DEXAMETHASONE SODIUM PHOSPHATE 4 MG/ML IJ SOLN
INTRAMUSCULAR | Status: DC | PRN
  Administered 2017-08-14: 10 mg via INTRAVENOUS

## 2017-08-14 MED ORDER — ACETAMINOPHEN 500 MG PO TABS
1000.0000 mg | ORAL_TABLET | ORAL | Status: AC
  Administered 2017-08-14: 1000 mg via ORAL

## 2017-08-14 MED ORDER — MIDAZOLAM HCL 2 MG/2ML IJ SOLN: 1.0000 mg | INTRAMUSCULAR | Status: DC | PRN

## 2017-08-14 MED ORDER — PROPOFOL 10 MG/ML IV BOLUS
INTRAVENOUS | Status: DC | PRN
  Administered 2017-08-14: 50 mg via INTRAVENOUS
  Administered 2017-08-14: 150 mg via INTRAVENOUS

## 2017-08-14 MED ORDER — MIDAZOLAM HCL 5 MG/5ML IJ SOLN
INTRAMUSCULAR | Status: DC | PRN
  Administered 2017-08-14: 2 mg via INTRAVENOUS

## 2017-08-14 MED ORDER — KETOROLAC TROMETHAMINE 15 MG/ML IJ SOLN
15.0000 mg | INTRAMUSCULAR | Status: AC
  Administered 2017-08-14: 15 mg via INTRAVENOUS

## 2017-08-14 MED ORDER — PROMETHAZINE HCL 25 MG/ML IJ SOLN: 6.2500 mg | INTRAMUSCULAR | Status: DC | PRN

## 2017-08-14 MED ORDER — FENTANYL CITRATE (PF) 100 MCG/2ML IJ SOLN: 50.0000 ug | INTRAMUSCULAR | Status: DC | PRN

## 2017-08-14 MED ORDER — ONDANSETRON HCL 4 MG/2ML IJ SOLN
INTRAMUSCULAR | Status: DC | PRN
  Administered 2017-08-14: 4 mg via INTRAVENOUS

## 2017-08-14 MED ORDER — ONDANSETRON HCL 4 MG/2ML IJ SOLN
INTRAMUSCULAR | Status: AC
  Filled 2017-08-14: qty 2

## 2017-08-14 MED ORDER — DEXAMETHASONE SODIUM PHOSPHATE 10 MG/ML IJ SOLN
INTRAMUSCULAR | Status: AC
  Filled 2017-08-14: qty 1

## 2017-08-14 MED ORDER — GABAPENTIN 300 MG PO CAPS
300.0000 mg | ORAL_CAPSULE | ORAL | Status: AC
  Administered 2017-08-14: 300 mg via ORAL

## 2017-08-14 MED ORDER — SCOPOLAMINE 1 MG/3DAYS TD PT72: 1.0000 | MEDICATED_PATCH | Freq: Once | TRANSDERMAL | Status: DC | PRN

## 2017-08-14 MED ORDER — FENTANYL CITRATE (PF) 100 MCG/2ML IJ SOLN: 25.0000 ug | INTRAMUSCULAR | Status: DC | PRN

## 2017-08-14 MED ORDER — PHENYLEPHRINE 40 MCG/ML (10ML) SYRINGE FOR IV PUSH (FOR BLOOD PRESSURE SUPPORT)
PREFILLED_SYRINGE | INTRAVENOUS | Status: AC
Start: 2017-08-14 — End: 2017-08-14
  Filled 2017-08-14: qty 10

## 2017-08-14 MED ORDER — LACTATED RINGERS IV SOLN
INTRAVENOUS | Status: DC
  Administered 2017-08-14 (×2): via INTRAVENOUS

## 2017-08-14 MED ORDER — LIDOCAINE HCL (CARDIAC) 20 MG/ML IV SOLN
INTRAVENOUS | Status: DC | PRN
  Administered 2017-08-14: 80 mg via INTRAVENOUS

## 2017-08-14 MED ORDER — GABAPENTIN 300 MG PO CAPS
ORAL_CAPSULE | ORAL | Status: AC
  Filled 2017-08-14: qty 1

## 2017-08-14 MED ORDER — CEFAZOLIN SODIUM-DEXTROSE 2-4 GM/100ML-% IV SOLN
2.0000 g | INTRAVENOUS | Status: AC
  Administered 2017-08-14: 2 g via INTRAVENOUS

## 2017-08-14 MED ORDER — ACETAMINOPHEN 500 MG PO TABS
ORAL_TABLET | ORAL | Status: AC
  Filled 2017-08-14: qty 2

## 2017-08-14 MED ORDER — FENTANYL CITRATE (PF) 100 MCG/2ML IJ SOLN
INTRAMUSCULAR | Status: AC
  Filled 2017-08-14: qty 2

## 2017-08-14 MED ORDER — TRAMADOL HCL 50 MG PO TABS: 100.0000 mg | ORAL_TABLET | Freq: Four times a day (QID) | ORAL | 0 refills | Status: DC | PRN

## 2017-08-14 SURGICAL SUPPLY — 56 items
APPLIER CLIP 9.375 MED OPEN (MISCELLANEOUS)
BINDER BREAST LRG (GAUZE/BANDAGES/DRESSINGS) IMPLANT
BINDER BREAST MEDIUM (GAUZE/BANDAGES/DRESSINGS) IMPLANT
BINDER BREAST XLRG (GAUZE/BANDAGES/DRESSINGS) IMPLANT
BINDER BREAST XXLRG (GAUZE/BANDAGES/DRESSINGS) IMPLANT
BLADE SURG 15 STRL LF DISP TIS (BLADE) ×1 IMPLANT
BLADE SURG 15 STRL SS (BLADE) ×2
CANISTER SUCT 1200ML W/VALVE (MISCELLANEOUS) IMPLANT
CHLORAPREP W/TINT 26ML (MISCELLANEOUS) ×3 IMPLANT
CLIP APPLIE 9.375 MED OPEN (MISCELLANEOUS) IMPLANT
CLIP VESOCCLUDE SM WIDE 6/CT (CLIP) IMPLANT
CLOSURE WOUND 1/2 X4 (GAUZE/BANDAGES/DRESSINGS) ×1
COVER BACK TABLE 60X90IN (DRAPES) ×3 IMPLANT
COVER MAYO STAND STRL (DRAPES) ×3 IMPLANT
COVER PROBE 5X48 (MISCELLANEOUS) ×2
DECANTER SPIKE VIAL GLASS SM (MISCELLANEOUS) IMPLANT
DERMABOND ADVANCED (GAUZE/BANDAGES/DRESSINGS) ×2
DERMABOND ADVANCED .7 DNX12 (GAUZE/BANDAGES/DRESSINGS) ×1 IMPLANT
DEVICE DUBIN W/COMP PLATE 8390 (MISCELLANEOUS) IMPLANT
DRAPE LAPAROSCOPIC ABDOMINAL (DRAPES) ×3 IMPLANT
DRAPE UTILITY XL STRL (DRAPES) ×3 IMPLANT
DRSG TEGADERM 4X4.75 (GAUZE/BANDAGES/DRESSINGS) IMPLANT
ELECT COATED BLADE 2.86 ST (ELECTRODE) ×3 IMPLANT
ELECT REM PT RETURN 9FT ADLT (ELECTROSURGICAL) ×3
ELECTRODE REM PT RTRN 9FT ADLT (ELECTROSURGICAL) ×1 IMPLANT
GAUZE SPONGE 4X4 12PLY STRL LF (GAUZE/BANDAGES/DRESSINGS) ×3 IMPLANT
GLOVE BIO SURGEON STRL SZ7 (GLOVE) ×6 IMPLANT
GLOVE BIOGEL PI IND STRL 7.5 (GLOVE) ×2 IMPLANT
GLOVE BIOGEL PI INDICATOR 7.5 (GLOVE) ×4
GOWN STRL REUS W/ TWL LRG LVL3 (GOWN DISPOSABLE) ×2 IMPLANT
GOWN STRL REUS W/TWL LRG LVL3 (GOWN DISPOSABLE) ×4
HEMOSTAT ARISTA ABSORB 3G PWDR (MISCELLANEOUS) IMPLANT
ILLUMINATOR WAVEGUIDE N/F (MISCELLANEOUS) ×3 IMPLANT
KIT CVR 48X5XPRB PLUP LF (MISCELLANEOUS) ×1 IMPLANT
KIT MARKER MARGIN INK (KITS) ×3 IMPLANT
LIGHT WAVEGUIDE WIDE FLAT (MISCELLANEOUS) IMPLANT
NEEDLE HYPO 25X1 1.5 SAFETY (NEEDLE) ×3 IMPLANT
NS IRRIG 1000ML POUR BTL (IV SOLUTION) IMPLANT
PACK BASIN DAY SURGERY FS (CUSTOM PROCEDURE TRAY) ×3 IMPLANT
PENCIL BUTTON HOLSTER BLD 10FT (ELECTRODE) ×3 IMPLANT
SLEEVE SCD COMPRESS KNEE MED (MISCELLANEOUS) ×3 IMPLANT
SPONGE LAP 4X18 X RAY DECT (DISPOSABLE) ×3 IMPLANT
STRIP CLOSURE SKIN 1/2X4 (GAUZE/BANDAGES/DRESSINGS) ×2 IMPLANT
SUT MNCRL AB 4-0 PS2 18 (SUTURE) ×3 IMPLANT
SUT MON AB 5-0 PS2 18 (SUTURE) IMPLANT
SUT SILK 2 0 SH (SUTURE) IMPLANT
SUT VIC AB 2-0 SH 27 (SUTURE) ×2
SUT VIC AB 2-0 SH 27XBRD (SUTURE) ×1 IMPLANT
SUT VIC AB 3-0 SH 27 (SUTURE) ×2
SUT VIC AB 3-0 SH 27X BRD (SUTURE) ×1 IMPLANT
SYR CONTROL 10ML LL (SYRINGE) ×3 IMPLANT
TOWEL OR 17X24 6PK STRL BLUE (TOWEL DISPOSABLE) ×3 IMPLANT
TOWEL OR NON WOVEN STRL DISP B (DISPOSABLE) ×3 IMPLANT
TUBE CONNECTING 20'X1/4 (TUBING)
TUBE CONNECTING 20X1/4 (TUBING) IMPLANT
YANKAUER SUCT BULB TIP NO VENT (SUCTIONS) IMPLANT

## 2017-08-14 NOTE — Anesthesia Preprocedure Evaluation (Addendum)
Anesthesia Evaluation  Patient identified by MRN, date of birth, ID band Patient awake    Reviewed: Allergy & Precautions, NPO status , Patient's Chart, lab work & pertinent test results  Airway Mallampati: II  TM Distance: >3 FB Neck ROM: Full    Dental  (+) Teeth Intact, Dental Advisory Given   Pulmonary neg pulmonary ROS,    Pulmonary exam normal breath sounds clear to auscultation       Cardiovascular Exercise Tolerance: Good negative cardio ROS Normal cardiovascular exam Rhythm:Regular Rate:Normal     Neuro/Psych  Headaches, Sciatica  negative psych ROS   GI/Hepatic negative GI ROS, (+)     substance abuse  marijuana use,   Endo/Other  negative endocrine ROS  Renal/GU negative Renal ROS     Musculoskeletal negative musculoskeletal ROS (+)   Abdominal   Peds  Hematology  (+) Blood dyscrasia, Sickle cell trait ,   Anesthesia Other Findings Day of surgery medications reviewed with the patient.  Right breast mass   Reproductive/Obstetrics negative OB ROS IUD                            Anesthesia Physical Anesthesia Plan  ASA: II  Anesthesia Plan: General   Post-op Pain Management:    Induction: Intravenous  PONV Risk Score and Plan: 3 and Midazolam, Dexamethasone and Ondansetron  Airway Management Planned: LMA  Additional Equipment:   Intra-op Plan:   Post-operative Plan: Extubation in OR  Informed Consent: I have reviewed the patients History and Physical, chart, labs and discussed the procedure including the risks, benefits and alternatives for the proposed anesthesia with the patient or authorized representative who has indicated his/her understanding and acceptance.   Dental advisory given  Plan Discussed with: CRNA  Anesthesia Plan Comments:         Anesthesia Quick Evaluation

## 2017-08-14 NOTE — H&P (Signed)
7 yof referred by Dr Justin Mend for right breast mass. she noted in december that she had a right breast mass. this is now causing her some discomfort. she also notes she has an area in left liq that has shown up since then and is tender. she has no nipple dc. she has fh in pgm of breast cancer in 36s. she underwent US of the right breast that shows at 230 6 cm from nipple a 17x9x17 mm mass. she was recommended six month follow up for this but is concerned about malignancy and the tenderness.   Past Surgical History Levonne Spiller, CMA; 07/17/2017 9:18 AM) No pertinent past surgical history   Diagnostic Studies History Levonne Spiller, CMA; 07/17/2017 9:18 AM) Colonoscopy  never Mammogram  never Pap Smear  1-5 years ago  Allergies Levonne Spiller, CMA; 07/17/2017 9:18 AM) No Known Allergies [07/17/2017]: Allergies Reconciled   Medication History Levonne Spiller, CMA; 07/17/2017 9:19 AM) PredniSONE (Oral) Specific strength unknown - Active. IUD's (Intrauterine) Active. Medications Reconciled  Social History Andee Poles Education officer, [employer], CMA; 07/17/2017 9:18 AM) Alcohol use  Occasional alcohol use. Caffeine use  Carbonated beverages, Coffee, Tea. No drug use  Tobacco use  Current some day smoker.  Family History Levonne Spiller, [place]; 07/17/2017 9:18 AM) Alcohol Abuse  Family Members In General. Bleeding disorder  Father. Breast Cancer  Family Members In General. Cancer  Family Members In General. Cervical Cancer  Family Members In [place] Members In General. Depression  Mother. Diabetes Mellitus  Family Members In General. Hypertension  Mother. Malignant Neoplasm Of Pancreas  Family Members In General. Migraine Headache  Family Members In General.  Pregnancy / Birth History Levonne Spiller, [place]; 07/17/2017 9:18 AM) Age at menarche  10 years. Contraceptive History  Intrauterine device. Gravida  0 Para  0 Regular  periods   Other Problems Levonne Spiller, CMA; 07/17/2017 9:18 AM) Back Pain  Lump In Breast  Migraine Headache     Review of Systems Andee Poles Gerrigner CMA; 07/17/2017 9:18 AM) General Present- Appetite Loss, Fatigue and Night Sweats. Not Present- Chills, Fever, Weight Gain and Weight Loss. Skin Not Present- Change in Wart/Mole, Dryness, Hives, Jaundice, New Lesions, Non-Healing Wounds, Rash and Ulcer. HEENT Not Present- Earache, Hearing Loss, Hoarseness, Nose Bleed, Oral Ulcers, Ringing in the Ears, Seasonal Allergies, Sinus Pain, Sore Throat, Visual Disturbances, Wears glasses/contact lenses and Yellow Eyes. Breast Present- Breast Mass, Breast Pain and Skin Changes. Not Present- Nipple Discharge. Cardiovascular Present- Swelling of Extremities. Not Present- Chest Pain, Difficulty Breathing Lying Down, Leg Cramps, Palpitations, Rapid Heart Rate and Shortness of Breath. Gastrointestinal Present- Change in Bowel Habits and Chronic diarrhea. Not Present- Abdominal Pain, Bloating, Bloody Stool, Constipation, Difficulty Swallowing, Excessive gas, Gets full quickly at meals, Hemorrhoids, Indigestion, Nausea, Rectal Pain and Vomiting. Female Genitourinary Not Present- Frequency, Nocturia, Painful Urination, Pelvic Pain and Urgency. Musculoskeletal Present- Back Pain. Not Present- Joint Pain, Joint Stiffness, Muscle Pain, Muscle Weakness and Swelling of Extremities. Neurological Present- Headaches. Not Present- Decreased Memory, Fainting, Numbness, Seizures, Tingling, Tremor, Trouble walking and Weakness. Psychiatric Present- Depression. Not Present- Anxiety, Bipolar, Change in Sleep Pattern, Fearful and Frequent crying. Endocrine Present- Heat Intolerance. Not Present- Cold Intolerance, Excessive Hunger, Hair Changes, Hot flashes and New Diabetes. Hematology Not Present- Blood Thinners, Easy Bruising, Excessive bleeding, Gland problems, HIV and Persistent Infections.  Vitals Andee Poles  Gerrigner CMA; 07/17/2017 9:19 AM) 07/17/2017 9:19 AM Weight: 189.25 lb Height: 62in Body Surface Area: 1.87 m Body Mass Index: 34.61 kg/m  Temp.: 98.89F(Oral)  Pulse: 79 (Regular)  BP: 128/72 (Sitting, Right Arm, Standard)       Physical Exam Rolm Bookbinder MD; 07/17/2017 10:31 AM) General Mental Status-Alert. Orientation-Oriented X3.  Eye Sclera/Conjunctiva - Bilateral-No scleral icterus.  Chest and Lung Exam Chest and lung exam reveals -quiet, even and easy respiratory effort with no use of accessory muscles and on auscultation, normal breath sounds, no adventitious sounds and normal vocal resonance.  Breast Nipples-No Discharge.   Cardiovascular Cardiovascular examination reveals -normal heart sounds, regular rate and rhythm with no murmurs.  Lymphatic Head & Neck  General Head & Neck Lymphatics: Bilateral - Description - Normal. Axillary  General Axillary Region: Bilateral - Description - Normal. Note: no [place] adenopathy     Assessment & Plan Rolm Bookbinder MD; 07/17/2017 10:39 AM) BREAST MASS, RIGHT (N63.10) Story: I discussed observation vs excision. I think fine to observe but she is interested in excision. I will send her for left breast US and then have her return

## 2017-08-14 NOTE — Transfer of Care (Signed)
Immediate Anesthesia Transfer of Care Note  Patient: Batya Citron  Procedure(s) Performed: EXCISION RIGHT BREAST MASS WITH ULTRASOUND (Right Breast)  Patient Location: PACU  Anesthesia Type:General  Level of Consciousness: awake and patient cooperative  Airway & Oxygen Therapy: Patient Spontanous Breathing and Patient connected to face mask oxygen  Post-op Assessment: Report given to RN and Post -op Vital signs reviewed and stable  Post vital signs: Reviewed and stable  Last Vitals:  Vitals Value Taken Time  BP    Temp    Pulse 79 08/14/2017  2:12 PM  Resp 13 08/14/2017  2:13 PM  SpO2 100 % 08/14/2017  2:12 PM  Vitals shown include unvalidated device data.  Last Pain:  Vitals:   08/14/17 1227  TempSrc: Oral  PainSc: 0-No pain         Complications: No apparent anesthesia complications

## 2017-08-14 NOTE — Op Note (Signed)
Preoperative diagnosis: Right breast mass Postoperative diagnosis: Same as above Procedure: Excision of right breast mass Surgeon: Dr. Serita Grammes Anesthesia: General Specimens: Right breast tissue with additional tissue marked with paint Estimated blood loss: Minimal Complications: None Drains: None Sponge needle count was correct at completion Disposition to recovery stable  Indications: This is a healthy 22 year old female who has by her exam and increasing right breast mass.  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  We gave her the option of just watching this or having it excised.  She desired excision.  By the time I saw her this area actually feels smaller.  She still wanted to proceed with having it excised.  Both she and I identified the area prior to beginning.  Procedure: After informed consent was obtained the patient was taken to the operating room.  She was given antibiotics.  SCDs were in place.  She was placed under general anesthesia without complication.  Her breast was prepped and draped in the standard sterile surgical fashion.  A surgical timeout was then performed.  I elected to make an incision overlying the area.  I infiltrated some Marcaine.  I made a small curvilinear incision.  I then dissected down to the area of the mass which was removed.  There was another small area right next to it that I also excised.  There was no other abnormality after examining her.  I then obtained hemostasis.  I closed the breast tissue with 2-0 Vicryl.  The skin was closed with 3-0 Vicryl and 4-0 Monocryl.  Glue and Steri-Strips were applied.  She tolerated this well and was transferred to recovery stable.

## 2017-08-14 NOTE — Interval H&P Note (Signed)
History and Physical Interval Note:  08/14/2017 1:19 PM  Hannah Hicks  has presented today for surgery, with the diagnosis of right breast mass  The various methods of treatment have been discussed with the patient and family. After consideration of risks, benefits and other options for treatment, the patient has consented to  Procedure(s): EXCISION RIGHT BREAST MASS WITH ULTRASOUND (Right) as a surgical intervention .  The patient's history has been reviewed, patient examined, no change in status, stable for surgery.  I have reviewed the patient's chart and labs.  Questions were answered to the patient's satisfaction.     Rolm Bookbinder

## 2017-08-14 NOTE — Anesthesia Procedure Notes (Signed)
Procedure Name: LMA Insertion Date/Time: 08/14/2017 1:34 PM Performed by: Marrianne Mood, CRNA Pre-anesthesia Checklist: Patient identified, Emergency Drugs available, Suction available, Patient being monitored and Timeout performed Patient Re-evaluated:Patient Re-evaluated prior to induction Oxygen Delivery Method: Circle system utilized Preoxygenation: Pre-oxygenation with 100% oxygen Induction Type: IV induction Ventilation: Mask ventilation without difficulty LMA: LMA inserted LMA Size: 4.0 Number of attempts: 1 Airway Equipment and Method: Bite block Placement Confirmation: positive ETCO2 Tube secured with: Tape Dental Injury: Teeth and Oropharynx as per pre-operative assessment

## 2017-08-14 NOTE — Discharge Instructions (Signed)
[place] Office Phone Number [phone]   POST OP INSTRUCTIONS  Always review your discharge instruction sheet given to you by the facility where your surgery was performed.  IF YOU HAVE DISABILITY OR FAMILY LEAVE FORMS, YOU MUST BRING THEM TO THE OFFICE FOR PROCESSING.  DO NOT GIVE THEM TO YOUR DOCTOR.  1. A prescription for pain medication may be given to you upon discharge.  Take your pain medication as prescribed, if needed.  If narcotic pain medicine is not needed, then you may take acetaminophen (Tylenol), naprosyn (Alleve) or ibuprofen (Advil) as needed. 2. Take your usually prescribed medications unless otherwise directed 3. If you need a refill on your pain medication, please contact your pharmacy.  They will contact our office to request authorization.  Prescriptions will not be filled after 5pm or on week-ends. 4. You should eat very light the first 24 hours after surgery, such as soup, crackers, pudding, etc.  Resume your normal diet the day after surgery. 5. Most patients will experience some swelling and bruising in the breast.  Ice packs and a good support bra will help.  Wear the breast binder provided or a sports bra for 72 hours day and night.  After that wear a sports bra during the day until you return to the office. Swelling and bruising can take several days to resolve.  6. It is common to experience some constipation if taking pain medication after surgery.  Increasing fluid intake and taking a stool softener will usually help or prevent this problem from occurring.  A mild laxative (Milk of Magnesia or Miralax) should be taken according to package directions if there are no bowel movements after 48 hours. 7. Unless discharge instructions indicate otherwise, you may remove your bandages 48 hours after surgery and you may shower at that time.  You may have steri-strips (small skin tapes) in place directly over the incision.  These strips should be left on the  skin for 7-10 days and will come off on their own.  If your surgeon used skin glue on the incision, you may shower in 24 hours.  The glue will flake off over the next 2-3 weeks.  Any sutures or staples will be removed at the office during your follow-up visit. 8. ACTIVITIES:  You may resume regular daily activities (gradually increasing) beginning the next day.  Wearing a good support bra or sports bra minimizes pain and swelling.  You may have sexual intercourse when it is comfortable. a. You may drive when you no longer are taking prescription pain medication, you can comfortably wear a seatbelt, and you can safely maneuver your car and apply brakes. b. RETURN TO WORK:  ______________________________________________________________________________________ 9. You should see your doctor in the office for a follow-up appointment approximately two weeks after your surgery.  Your doctors nurse will typically make your follow-up appointment when she calls you with your pathology report.  Expect your pathology report 3-4 business days after your surgery.  You may call to check if you do not hear from [place] after three days. 10. OTHER INSTRUCTIONS: _______________________________________________________________________________________________ _____________________________________________________________________________________________________________________________________ _____________________________________________________________________________________________________________________________________ _____________________________________________________________________________________________________________________________________  WHEN TO CALL DR WAKEFIELD: 1. Fever over 101.0 2. Nausea and/or vomiting. 3. Extreme swelling or bruising. 4. Continued bleeding from incision. 5. Increased pain, redness, or drainage from the incision.  The clinic staff is available to answer your questions during regular  business hours.  Please dont hesitate to call and ask to speak to one of the nurses for clinical concerns.  If you have a medical emergency, go to the nearest emergency room or call 911.  A surgeon from [facility] is always on call at the hospital.  For further questions, please visit [email or web address]    Post Anesthesia Home Care Instructions  Activity: Get plenty of rest for the remainder of the day. A responsible individual must stay with you for 24 hours following the procedure.  For the next 24 hours, DO NOT: -Drive a car -Paediatric nurse -Drink alcoholic beverages -Take any medication unless instructed by your physician -Make any legal decisions or sign important papers.  Meals: Start with liquid foods such as gelatin or soup. Progress to regular foods as tolerated. Avoid greasy, spicy, heavy foods. If nausea and/or vomiting occur, drink only clear liquids until the nausea and/or vomiting subsides. Call your physician if vomiting continues.  Special Instructions/Symptoms: Your throat may feel dry or sore from the anesthesia or the breathing tube placed in your throat during surgery. If this causes discomfort, gargle with warm salt water. The discomfort should disappear within 24 hours.  If you had a scopolamine patch placed behind your ear for the management of post- operative nausea and/or vomiting:  1. The medication in the patch is effective for 72 hours, after which it should be removed.  Wrap patch in a tissue and discard in the trash. Wash hands thoroughly with soap and water. 2. You may remove the patch earlier than 72 hours if you experience unpleasant side effects which may include dry mouth, dizziness or visual disturbances. 3. Avoid touching the patch. Wash your hands with soap and water after contact with the patch.   Post Anesthesia Home Care Instructions  Activity: Get plenty of rest for the remainder of the day. A responsible  individual must stay with you for 24 hours following the procedure.  For the next 24 hours, DO NOT: -Drive a car -Paediatric nurse -Drink alcoholic beverages -Take any medication unless instructed by your physician -Make any legal decisions or sign important papers.  Meals: Start with liquid foods such as gelatin or soup. Progress to regular foods as tolerated. Avoid greasy, spicy, heavy foods. If nausea and/or vomiting occur, drink only clear liquids until the nausea and/or vomiting subsides. Call your physician if vomiting continues.  Special Instructions/Symptoms: Your throat may feel dry or sore from the anesthesia or the breathing tube placed in your throat during surgery. If this causes discomfort, gargle with warm salt water. The discomfort should disappear within 24 hours.  If you had a scopolamine patch placed behind your ear for the management of post- operative nausea and/or vomiting:  1. The medication in the patch is effective for 72 hours, after which it should be removed.  Wrap patch in a tissue and discard in the trash. Wash hands thoroughly with soap and water. 2. You may remove the patch earlier than 72 hours if you experience unpleasant side effects which may include dry mouth, dizziness or visual disturbances. 3. Avoid touching the patch. Wash your hands with soap and water after contact with the patch.

## 2017-08-14 NOTE — Anesthesia Postprocedure Evaluation (Signed)
Anesthesia Post Note  Patient: Hannah Hicks  Procedure(s) Performed: EXCISION RIGHT BREAST MASS WITH ULTRASOUND (Right Breast)     Patient location during evaluation: PACU Anesthesia Type: General Level of consciousness: awake and alert Pain management: pain level controlled Vital Signs Assessment: post-procedure vital signs reviewed and stable Respiratory status: spontaneous breathing, nonlabored ventilation and respiratory function stable Cardiovascular status: blood pressure returned to baseline and stable Postop Assessment: no apparent nausea or vomiting Anesthetic complications: no    Last Vitals:  Vitals:   08/14/17 1445 08/14/17 1515  BP: 121/73 116/74  Pulse: 81 74  Resp: 16 16  Temp:  36.4 C  SpO2: 100% 100%    Last Pain:  Vitals:   08/14/17 1515  TempSrc:   PainSc: 0-No pain                 Catalina Gravel

## 2017-08-15 ENCOUNTER — Encounter (HOSPITAL_BASED_OUTPATIENT_CLINIC_OR_DEPARTMENT_OTHER): Payer: Self-pay | Admitting: General Surgery

## 2017-10-30 ENCOUNTER — Other Ambulatory Visit: Payer: Medicaid Other

## 2017-12-12 ENCOUNTER — Encounter (HOSPITAL_COMMUNITY): Payer: Self-pay

## 2017-12-12 ENCOUNTER — Other Ambulatory Visit: Payer: Self-pay

## 2017-12-12 ENCOUNTER — Ambulatory Visit (HOSPITAL_COMMUNITY)
Admission: EM | Admit: 2017-12-12 | Discharge: 2017-12-12 | Disposition: A | Discharge status: Home / Self Care | Payer: Self-pay | Attending: Family Medicine | Admitting: Family Medicine

## 2017-12-12 DIAGNOSIS — M545 Low back pain: Secondary | ICD-10-CM

## 2017-12-12 NOTE — Discharge Instructions (Signed)
Heat (pad or rice pillow in microwave) over affected area, 10-15 minutes twice daily.   Continue ibuprofen and Tylenol.  EXERCISES  RANGE OF MOTION (ROM) AND STRETCHING EXERCISES - Low Back Pain Most people with lower back pain will find that their symptoms get worse with excessive bending forward (flexion) or arching at the lower back (extension). The exercises that will help resolve your symptoms will focus on the opposite motion.  If you have pain, numbness or tingling which travels down into your buttocks, leg or foot, the goal of the therapy is for these symptoms to move closer to your back and eventually resolve. Sometimes, these leg symptoms will get better, but your lower back pain may worsen. This is often an indication of progress in your rehabilitation. Be very alert to any changes in your symptoms and the activities in which you participated in the 24 hours prior to the change. Sharing this information with your caregiver will allow him or her to most efficiently treat your condition. These exercises may help you when beginning to rehabilitate your injury. Your symptoms may resolve with or without further involvement from your physician, physical therapist or athletic trainer. While completing these exercises, remember:  Restoring tissue flexibility helps normal motion to return to the joints. This allows healthier, less painful movement and activity. An effective stretch should be held for at least 30 seconds. A stretch should never be painful. You should only feel a gentle lengthening or release in the stretched tissue. FLEXION RANGE OF MOTION AND STRETCHING EXERCISES:  STRETCH - Flexion, Single Knee to Chest  Lie on a firm bed or floor with both legs extended in front of you. Keeping one leg in contact with the floor, bring your opposite knee to your chest. Hold your leg in place by either grabbing behind your thigh or at your knee. Pull until you feel a gentle stretch in your low  back. Hold 30 seconds. Slowly release your grasp and repeat the exercise with the opposite side. Repeat 2 times. Complete this exercise 3 times per week.   STRETCH - Flexion, Double Knee to Chest Lie on a firm bed or floor with both legs extended in front of you. Keeping one leg in contact with the floor, bring your opposite knee to your chest. Tense your stomach muscles to support your back and then lift your other knee to your chest. Hold your legs in place by either grabbing behind your thighs or at your knees. Pull both knees toward your chest until you feel a gentle stretch in your low back. Hold 30 seconds. Tense your stomach muscles and slowly return one leg at a time to the floor. Repeat 2 times. Complete this exercise 3 times per week.   STRETCH - Low Trunk Rotation Lie on a firm bed or floor. Keeping your legs in front of you, bend your knees so they are both pointed toward the ceiling and your feet are flat on the floor. Extend your arms out to the side. This will stabilize your upper body by keeping your shoulders in contact with the floor. Gently and slowly drop both knees together to one side until you feel a gentle stretch in your low back. Hold for 30 seconds. Tense your stomach muscles to support your lower back as you bring your knees back to the starting position. Repeat the exercise to the other side. Repeat 2 times. Complete this exercise at least 3 times per week.   EXTENSION RANGE OF MOTION AND  FLEXIBILITY EXERCISES:  STRETCH - Extension, Prone on Elbows  Lie on your stomach on the floor, a bed will be too soft. Place your palms about shoulder width apart and at the height of your head. Place your elbows under your shoulders. If this is too painful, stack pillows under your chest. Allow your body to relax so that your hips drop lower and make contact more completely with the floor. Hold this position for 30 seconds. Slowly return to lying flat on the floor. Repeat 2  times. Complete this exercise 3 times per week.   RANGE OF MOTION - Extension, Prone Press Ups Lie on your stomach on the floor, a bed will be too soft. Place your palms about shoulder width apart and at the height of your head. Keeping your back as relaxed as possible, slowly straighten your elbows while keeping your hips on the floor. You may adjust the placement of your hands to maximize your comfort. As you gain motion, your hands will come more underneath your shoulders. Hold this position 30 seconds. Slowly return to lying flat on the floor. Repeat 2 times. Complete this exercise 3 times per week.   RANGE OF MOTION- Quadruped, Neutral Spine  Assume a hands and knees position on a firm surface. Keep your hands under your shoulders and your knees under your hips. You may place padding under your knees for comfort. Drop your head and point your tailbone toward the ground below you. This will round out your lower back like an angry cat. Hold this position for 30 seconds. Slowly lift your head and release your tail bone so that your back sags into a large arch, like an old horse. Hold this position for 30 seconds. Repeat this until you feel limber in your low back. Now, find your "sweet spot." This will be the most comfortable position somewhere between the two previous positions. This is your neutral spine. Once you have found this position, tense your stomach muscles to support your low back. Hold this position for 30 seconds. Repeat 2 times. Complete this exercise 3 times per week.   STRENGTHENING EXERCISES - Low Back Sprain These exercises may help you when beginning to rehabilitate your injury. These exercises should be done near your "sweet spot." This is the neutral, low-back arch, somewhere between fully rounded and fully arched, that is your least painful position. When performed in this safe range of motion, these exercises can be used for people who have either a flexion or extension  based injury. These exercises may resolve your symptoms with or without further involvement from your physician, physical therapist or athletic trainer. While completing these exercises, remember:  Muscles can gain both the endurance and the strength needed for everyday activities through controlled exercises. Complete these exercises as instructed by your physician, physical therapist or athletic trainer. Increase the resistance and repetitions only as guided. You may experience muscle soreness or fatigue, but the pain or discomfort you are trying to eliminate should never worsen during these exercises. If this pain does worsen, stop and make certain you are following the directions exactly. If the pain is still present after adjustments, discontinue the exercise until you can discuss the trouble with your caregiver.  STRENGTHENING - Deep Abdominals, Pelvic Tilt  Lie on a firm bed or floor. Keeping your legs in front of you, bend your knees so they are both pointed toward the ceiling and your feet are flat on the floor. Tense your lower abdominal muscles to press  your low back into the floor. This motion will rotate your pelvis so that your tail bone is scooping upwards rather than pointing at your feet or into the floor. With a gentle tension and even breathing, hold this position for 3 seconds. Repeat 2 times. Complete this exercise 3 times per week.   STRENGTHENING - Abdominals, Crunches  Lie on a firm bed or floor. Keeping your legs in front of you, bend your knees so they are both pointed toward the ceiling and your feet are flat on the floor. Cross your arms over your chest. Slightly tip your chin down without bending your neck. Tense your abdominals and slowly lift your trunk high enough to just clear your shoulder blades. Lifting higher can put excessive stress on the lower back and does not further strengthen your abdominal muscles. Control your return to the starting position. Repeat 2  times. Complete this exercise 3 times per week.   STRENGTHENING - Quadruped, Opposite UE/LE Lift  Assume a hands and knees position on a firm surface. Keep your hands under your shoulders and your knees under your hips. You may place padding under your knees for comfort. Find your neutral spine and gently tense your abdominal muscles so that you can maintain this position. Your shoulders and hips should form a rectangle that is parallel with the floor and is not twisted. Keeping your trunk steady, lift your right hand no higher than your shoulder and then your left leg no higher than your hip. Make sure you are not holding your breath. Hold this position for 30 seconds. Continuing to keep your abdominal muscles tense and your back steady, slowly return to your starting position. Repeat with the opposite arm and leg. Repeat 2 times. Complete this exercise 3 times per week.   STRENGTHENING - Abdominals and Quadriceps, Straight Leg Raise  Lie on a firm bed or floor with both legs extended in front of you. Keeping one leg in contact with the floor, bend the other knee so that your foot can rest flat on the floor. Find your neutral spine, and tense your abdominal muscles to maintain your spinal position throughout the exercise. Slowly lift your straight leg off the floor about 6 inches for a count of 3, making sure to not hold your breath. Still keeping your neutral spine, slowly lower your leg all the way to the floor. Repeat this exercise with each leg 2 times. Complete this exercise 3 times per week.  POSTURE AND BODY MECHANICS CONSIDERATIONS - Low Back Sprain Keeping correct posture when sitting, standing or completing your activities will reduce the stress put on different body tissues, allowing injured tissues a chance to heal and limiting painful experiences. The following are general guidelines for improved posture.  While reading these guidelines, remember: The exercises prescribed by your  provider will help you have the flexibility and strength to maintain correct postures. The correct posture provides the best environment for your joints to work. All of your joints have less wear and tear when properly supported by a spine with good posture. This means you will experience a healthier, less painful body. Correct posture must be practiced with all of your activities, especially prolonged sitting and standing. Correct posture is as important when doing repetitive low-stress activities (typing) as it is when doing a single heavy-load activity (lifting).  RESTING POSITIONS Consider which positions are most painful for you when choosing a resting position. If you have pain with flexion-based activities (sitting, bending, stooping, squatting),  choose a position that allows you to rest in a less flexed posture. You would want to avoid curling into a fetal position on your side. If your pain worsens with extension-based activities (prolonged standing, working overhead), avoid resting in an extended position such as sleeping on your stomach. Most people will find more comfort when they rest with their spine in a more neutral position, neither too rounded nor too arched. Lying on a non-sagging bed on your side with a pillow between your knees, or on your back with a pillow under your knees will often provide some relief. Keep in mind, being in any one position for a prolonged period of time, no matter how correct your posture, can still lead to stiffness.  PROPER SITTING POSTURE In order to minimize stress and discomfort on your spine, you must sit with correct posture. Sitting with good posture should be effortless for a healthy body. Returning to good posture is a gradual process. Many people can work toward this most comfortably by using various supports until they have the flexibility and strength to maintain this posture on their own. When sitting with proper posture, your ears will fall over  your shoulders and your shoulders will fall over your hips. You should use the back of the chair to support your upper back. Your lower back will be in a neutral position, just slightly arched. You may place a small pillow or folded towel at the base of your lower back for  support.  When working at a desk, create an environment that supports good, upright posture. Without extra support, muscles tire, which leads to excessive strain on joints and other tissues. Keep these recommendations in mind:  CHAIR: A chair should be able to slide under your desk when your back makes contact with the back of the chair. This allows you to work closely. The chair's height should allow your eyes to be level with the upper part of your monitor and your hands to be slightly lower than your elbows.  BODY POSITION Your feet should make contact with the floor. If this is not possible, use a foot rest. Keep your ears over your shoulders. This will reduce stress on your neck and low back.  INCORRECT SITTING POSTURES  If you are feeling tired and unable to assume a healthy sitting posture, do not slouch or slump. This puts excessive strain on your back tissues, causing more damage and pain. Healthier options include: Using more support, like a lumbar pillow. Switching tasks to something that requires you to be upright or walking. Talking a brief walk. Lying down to rest in a neutral-spine position.  PROLONGED STANDING WHILE SLIGHTLY LEANING FORWARD  When completing a task that requires you to lean forward while standing in one place for a long time, place either foot up on a stationary 2-4 inch high object to help maintain the best posture. When both feet are on the ground, the lower back tends to lose its slight inward curve. If this curve flattens (or becomes too large), then the back and your other joints will experience too much stress, tire more quickly, and can cause pain.  CORRECT STANDING POSTURES Proper  standing posture should be assumed with all daily activities, even if they only take a few moments, like when brushing your teeth. As in sitting, your ears should fall over your shoulders and your shoulders should fall over your hips. You should keep a slight tension in your abdominal muscles to brace your spine.  Your tailbone should point down to the ground, not behind your body, resulting in an over-extended swayback posture.   INCORRECT STANDING POSTURES  Common incorrect standing postures include a forward head, locked knees and/or an excessive swayback. WALKING Walk with an upright posture. Your ears, shoulders and hips should all line-up.  PROLONGED ACTIVITY IN A FLEXED POSITION When completing a task that requires you to bend forward at your waist or lean over a low surface, try to find a way to stabilize 3 out of 4 of your limbs. You can place a hand or elbow on your thigh or rest a knee on the surface you are reaching across. This will provide you more stability, so that your muscles do not tire as quickly. By keeping your knees relaxed, or slightly bent, you will also reduce stress across your lower back. CORRECT LIFTING TECHNIQUES  DO : Assume a wide stance. This will provide you more stability and the opportunity to get as close as possible to the object which you are lifting. Tense your abdominals to brace your spine. Bend at the knees and hips. Keeping your back locked in a neutral-spine position, lift using your leg muscles. Lift with your legs, keeping your back straight. Test the weight of unknown objects before attempting to lift them. Try to keep your elbows locked down at your sides in order get the best strength from your shoulders when carrying an object.   Always ask for help when lifting heavy or awkward objects. INCORRECT LIFTING TECHNIQUES DO NOT:  Lock your knees when lifting, even if it is a small object. Bend and twist. Pivot at your feet or move your feet when  needing to change directions. Assume that you can safely pick up even a paperclip without proper posture.

## 2017-12-12 NOTE — ED Provider Notes (Signed)
  [facility]    CSN: [id number] Arrival date & time: 12/12/17  0800   Musculoskeletal Exam  Patient: Hannah Hicks DOB: 20-Apr-1996  DOS: 12/12/2017  SUBJECTIVE:  Chief Complaint:   Chief Complaint  Patient presents with  . Back Pain    Hannah Hicks is a 22 y.o.  female for evaluation and treatment of her back pain.   Onset: long-standing issue, started bothering her again 3 days ago. No inj or change in activity  Location: lower Character:  aching  Progression of issue:  is unchanged Associated symptoms: none Denies bowel/bladder incontinence or weakness Treatment: to date has been rest and OTC NSAIDS.   Neurovascular symptoms: no Needs a letter for work.   ROS: Musculoskeletal/Extremities: +back pain Neurologic: no numbness, tingling no weakness   Past Medical History:  Diagnosis Date  . Breast mass, right 08/2017  . Migraines   . Sciatica   . Sickle cell trait ([facility])     Objective:  VITAL SIGNS: BP 113/73 (BP Location: Left Arm)   Pulse 77   Temp 98.4 F (36.9 C) (Oral)   Resp 18   Wt 182 lb (82.6 kg)   LMP 11/20/2017   SpO2 98%   BMI 34.39 kg/m  Constitutional: Well formed, well developed. No acute distress. HENT: Normocephalic, atraumatic.  Thorax & Lungs:  No accessory muscle use Extremities: No clubbing. No cyanosis. No edema.  Skin: Warm. Dry. No erythema. No rash.  Musculoskeletal: low back.   Tenderness to palpation: mild over R lumbar paraspinal msc Deformity: no Ecchymosis: no Straight leg test: negative for Poor hamstring flexibility b/l. 5/5 strength throughout LE's.  Neurologic: Normal sensory function. No focal deficits noted. DTR's equal and symmetry in LE's. No clonus. Psychiatric: Normal mood. Age appropriate judgment and insight. Alert & oriented x 3.    Assessment:  Acute right-sided low back pain, with sciatica presence unspecified  Plan: Stretches/exercises, heat, ice, Tylenol, NSAIDs. Letter stating she may go  to work provided.  Find PCP in area. F/u prn. The patient voiced understanding and agreement to the plan.    [name], DO 12/12/17 1021
